# Patient Record
Sex: Female | Born: 2000 | Hispanic: Yes | Marital: Single | Smoking: Never smoker
Health system: Southern US, Community
[De-identification: ages and names within clinical notes are randomized; demographics above are authoritative.]

## PROBLEM LIST (undated history)

## (undated) DIAGNOSIS — Z8669 Personal history of other diseases of the nervous system and sense organs: Secondary | ICD-10-CM

## (undated) DIAGNOSIS — J45909 Unspecified asthma, uncomplicated: Secondary | ICD-10-CM

## (undated) DIAGNOSIS — E785 Hyperlipidemia, unspecified: Secondary | ICD-10-CM

## (undated) DIAGNOSIS — R51 Headache: Secondary | ICD-10-CM

## (undated) DIAGNOSIS — R519 Headache, unspecified: Secondary | ICD-10-CM

## (undated) HISTORY — DX: Headache: R51

## (undated) HISTORY — DX: Hyperlipidemia, unspecified: E78.5

## (undated) HISTORY — PX: TONSILLECTOMY: SUR1361

## (undated) HISTORY — DX: Headache, unspecified: R51.9

## (undated) HISTORY — DX: Unspecified asthma, uncomplicated: J45.909

## (undated) HISTORY — DX: Personal history of other diseases of the nervous system and sense organs: Z86.69

---
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
## 2018-03-04 ENCOUNTER — Telehealth: Payer: Self-pay | Admitting: *Deleted

## 2018-03-04 NOTE — Telephone Encounter (Signed)
Left message informing pt's mtr, pt would probably be able to drive, but if she had concerns she could have someone drive her and to make sure she wore a open toed shoe.

## 2018-03-04 NOTE — Telephone Encounter (Signed)
Pt's mtr, states pt is coming in for a toenail procedure on an infected toe, and she wanted to know if the pt would be able to drive after the procedure.

## 2018-03-05 ENCOUNTER — Ambulatory Visit (INDEPENDENT_AMBULATORY_CARE_PROVIDER_SITE_OTHER): Admitting: Podiatrist

## 2018-03-05 VITALS — BP 123/74 | HR 90

## 2018-03-05 DIAGNOSIS — L600 Ingrowing nail: Secondary | ICD-10-CM

## 2018-03-05 NOTE — Patient Instructions (Signed)
Soak Instructions    THE DAY AFTER THE PROCEDURE  Place 1/4 cup of epsom salts in a quart of warm tap water.  Submerge your foot or feet with outer bandage intact for the initial soak; this will allow the bandage to become moist and wet for easy lift off.  Once you remove your bandage, continue to soak in the solution for 20 minutes.  This soak should be done twice a day.  Next, remove your foot or feet from solution, blot dry the affected area and cover.  You may use a band aid large enough to cover the area or use gauze and tape.  Apply other medications to the area as directed by the doctor such as polysporin neosporin.   IT IS OKAY TO SWITCH TO CLEANSING THE TOE WITH DIAL ANTIBACTERIAL SOAP AND WATER-  AFTER 2 DAYS OF SOAKS.

## 2018-03-05 NOTE — Progress Notes (Signed)
  Chief Complaint  Patient presents with  . Nail Problem    Right foot 1st toenail lateral side ingrown. Pt states had it drained at urgent care 4 days ago. Pt denies fever/nausea/vomiting/chills.     HPI: Patient is 18 y.o. female who presents today for painful right hallux nail right first lateral side-  She states it was badly infected and it was drained last Friday at an urgent care in [place]-  Since then the nail has improved but it has given her trouble in the past and she would like it fixed.    Review of Systems  DATA OBTAINED: from patient  GENERAL: Feels well no fevers, no fatigue, no changes in appetite SKIN: No itching, no rashes, no open wounds EYES: No eye pain,no redness, no discharge EARS: No earache,no ringing of ears, NOSE: No congestion, no drainage, no bleeding  MOUTH/THROAT: No mouth pain, No sore throat, No difficulty chewing or swallowing  RESPIRATORY: No cough, no wheezing, no SOB CARDIAC: No chest pain,no heart palpitations, GI: No abdominal pain, No Nausea, no vomiting, no diarrhea, no heartburn or no reflux  GU: No dysuria, no increased frequency or urgency MUSCULOSKELETAL: No unrelieved bone/joint pain,  NEUROLOGIC: Awake, alert, appropriate to situation, No change in mental status. PSYCHIATRIC: No overt anxiety or sadness.No behavior issue.      Physical Exam  GENERAL APPEARANCE: Alert, conversant. Appropriately groomed. No acute distress.  VASCULAR: Pedal pulses palpable DP and PT bilateral.  Capillary refill time is immediate to all digits,  Proximal to distal cooling it warm to warm.  Digital hair growth is present bilateral  NEUROLOGIC: sensation is intact epicritically and protectively to 5.07 monofilament at 5/5 sites bilateral.  Light touch is intact bilateral, vibratory sensation intact bilateral, achilles tendon reflex is intact bilateral.  MUSCULOSKELETAL: acceptable muscle strength, tone and stability bilateral.  Intrinsic muscluature  intact bilateral.  Range of motion at ankle and first MPJ is normal bilateral.   DERMATOLOGIC: skin is warm, supple, and dry.  No open lesions noted.  No interdigital maceration noted bilateral.  Right hallux does not appear to be infected- no redness, swelling or drainage noted.  Lateral side of the toenail is painful with pressure and ingrown- non infected    Assessment   Non infected ingrown toenail right first lateral side  Plan  Treatment options and alternatives were discussed. Recommended a permanent removal of the  lateral nail border of the Right hallux nail. Patient agreed. Skin was prepped with alcohol and a local injection of lidocaine and Marcaine plain was infiltrated to anesthetize the toe. The toe was then prepped with Betadine exsanguinated. The offending nail border was removed and phenol applied.  It was cleansed well with alcohol. Antibiotic ointment and a dressing was then applied and the patient was given instructions for aftercare. She will be seen for a nail check in 1 week. And will call if any questions or concerns arise.  Marland Kitchen

## 2018-03-09 ENCOUNTER — Encounter: Payer: Self-pay | Admitting: Podiatrist

## 2018-03-12 ENCOUNTER — Ambulatory Visit (INDEPENDENT_AMBULATORY_CARE_PROVIDER_SITE_OTHER): Payer: Self-pay | Admitting: Podiatrist

## 2018-03-12 DIAGNOSIS — L600 Ingrowing nail: Secondary | ICD-10-CM

## 2018-03-12 NOTE — Progress Notes (Signed)
   Chief Complaint  Patient presents with  . Nail Problem    Right 1st lateral nail check. Pt states had some greenish drainage but has been mostly clear. Pt denies fever/nausea/vomiting/chills.    Patient presents today for follow up of ap nail procedure of the right hallux nail medial side- overall she is doing well.   Objective:  Neurovascular status intact and unchanged from previous visit.  Lateral side of the hallux nail is healing nicely- no sign of infection noted.    Assessment/Plan:  S/p ap nail- healing well- gave instructions for continued use of a bandaid and polysporin until no drainage is noticed on the bandage.  She will call if any concerns arise in the future.

## 2018-05-01 ENCOUNTER — Telehealth: Payer: Self-pay | Admitting: Podiatrist

## 2018-05-01 NOTE — Telephone Encounter (Signed)
Pt called stating that she hit her left great toenail on the wall last night and the nail split down the middle. Pt previously had ingrown toenail removed on the same toe and had some complications. Pt just concerned if the area will get infected and would like to know if she should come in to be seen or what she can do to keep it clean.

## 2018-05-02 ENCOUNTER — Telehealth: Payer: Self-pay | Admitting: Podiatrist

## 2018-05-02 NOTE — Telephone Encounter (Signed)
I called pt and she states day before yesterday she hit her toe that she had a procedure on and it bled at the time, she states she has been soaking and using the antibiotic ointment but wanted to make sure it would not get infected.

## 2018-05-02 NOTE — Telephone Encounter (Signed)
I called pt and she explained also that the toe would heal after the procedure but then would get a bump that would open up and she would continue to do the soaks and the area would heal then bump up again. I told pt that on occasion a small area of regrowth could occur or even scar tissue and may need another procedure, to continue the soaks and antibiotic ointment until seen in office. I transferred pt to schedulers.

## 2018-05-02 NOTE — Telephone Encounter (Signed)
Patient called back and would like to speak to nurse before scheduling an appointment.

## 2018-05-02 NOTE — Telephone Encounter (Signed)
LVM for patient to call with concerns about her toenail. Advised her to return my call or make an appt to be seen

## 2018-05-05 ENCOUNTER — Other Ambulatory Visit: Payer: Self-pay

## 2018-05-05 ENCOUNTER — Ambulatory Visit (INDEPENDENT_AMBULATORY_CARE_PROVIDER_SITE_OTHER): Admitting: Podiatry

## 2018-05-05 ENCOUNTER — Encounter: Payer: Self-pay | Admitting: Podiatry

## 2018-05-05 VITALS — Temp 97.7°F

## 2018-05-05 DIAGNOSIS — L600 Ingrowing nail: Secondary | ICD-10-CM | POA: Diagnosis not present

## 2018-05-05 DIAGNOSIS — L03031 Cellulitis of right toe: Secondary | ICD-10-CM

## 2018-05-05 NOTE — Patient Instructions (Signed)

## 2018-05-05 NOTE — Progress Notes (Signed)
Subjective:   Patient ID: Terri Davis, female   DOB: 18 y.o.   MRN: [id number]   HPI Patient presents with mother concerned about ingrown toenail deformity of the right hallux that seem to be doing well after correction but has started to give her a problem again and is making it difficult for her to wear shoe gear comfortably.  She points to the lateral side and there has been previous surgery done but they are concerned about infection   ROS      Objective:  Physical Exam  Incurvated right hallux nail lateral border with what appears to be some possible plugged tissue and still continued possible residual ingrown toenail     Assessment:  Appears to be a combination of possible paronychia infection right along with a ingrown toenail of the slightly medial border of the right lateral hallux to where the previous ingrown toenail was removed     Plan:  H&P education rendered and we did discuss she has family history of nail disease and long-term it is possible she is going to lose the entire nail.  We will get a focus on this corner and I did discuss soaks therapy and possible antibiotics and the fact there may be a localized paronychia and I educated her on this and at this point I did discuss there may be further ingrown toenail that may need to be corrected.  I went ahead today and I did infiltrate the right hallux 60 mg like Marcaine mixture I allowed her to signed consent form after review and sterile prep applied to the toe and then using sterile instrumentation I removed a small bit more of the lateral border of the right hallux nail lateral side I then cleaned the entire border out flushed the area out did not note any acute drainage and then applied chemical phenol 3 applications 30 seconds followed by alcohol lavage sterile dressing.  Gave instructions on soaks and reappoint for [place] to recheck again in the next several weeks and encouraged her to call with questions wrote a  prescription for drops and to leave dressing on 24 hours but to take it off earlier if any problems were to occur

## 2018-05-06 ENCOUNTER — Telehealth: Payer: Self-pay | Admitting: Podiatry

## 2018-05-06 MED ORDER — NEOMYCIN-POLYMYXIN-HC 1 % OT SOLN: OTIC | 1 refills | Status: AC

## 2018-05-06 NOTE — Telephone Encounter (Signed)
Left message informing pt the drops had not been sent to the requested pharmacy, but has been changed to her requested [facility] [id number].

## 2018-05-06 NOTE — Telephone Encounter (Signed)
Patient was seen yesterday for an ingrown toenail and was going to have a prescription for medicated drops sent in to her pharmacy but they have not received the prescription.  Pharmacy: [facility] on [address] in [place]

## 2018-05-06 NOTE — Addendum Note (Signed)
Addended by: Alphia Kava D on: 05/06/2018 01:51 PM   Modules accepted: Orders

## 2018-09-08 NOTE — Telephone Encounter (Signed)
Entered in error

## 2018-10-10 ENCOUNTER — Other Ambulatory Visit: Payer: Self-pay

## 2018-10-10 ENCOUNTER — Ambulatory Visit (INDEPENDENT_AMBULATORY_CARE_PROVIDER_SITE_OTHER): Admitting: Podiatry

## 2018-10-10 ENCOUNTER — Encounter: Payer: Self-pay | Admitting: Podiatry

## 2018-10-10 DIAGNOSIS — L600 Ingrowing nail: Secondary | ICD-10-CM | POA: Diagnosis not present

## 2018-10-10 MED ORDER — NEOMYCIN-POLYMYXIN-HC 3.5-10000-1 OT SOLN: OTIC | 1 refills | Status: AC

## 2018-10-10 NOTE — Patient Instructions (Signed)

## 2018-10-10 NOTE — Progress Notes (Signed)
   Subjective:    Patient ID: Terri Davis, female    DOB: [birth year], 18 y.o.   MRN: [id number]  HPI    Review of Systems  All other systems reviewed and are negative.      Objective:   Physical Exam        Assessment & Plan:

## 2018-10-15 NOTE — Progress Notes (Signed)
Subjective:   Patient ID: Terri Davis, female   DOB: 18 y.o.   MRN: [id number]   HPI Patient presents stating she is developed an ingrown toenail of her left big toe and states that it is sore and she is tried to soak it in treatment without relief of symptoms   ROS      Objective:  Physical Exam  Neurovascular status intact with incurvated lateral border left hallux is painful when pressed with no redness or drainage noted     Assessment:  Ingrown toenail deformity left hallux lateral border     Plan:  H&P reviewed condition recommended correction allowed patient to read and signed consent form going over correction.  Explained risk to patient she is willing to accept risk and today I infiltrated 60 mg like Marcaine mixture sterile prep applied to the toe and using sterile instrumentation I remove the lateral border exposed matrix applied phenol 3 applications 30 seconds followed by alcohol by sterile dressing and gave instructions on soaks.  Reappoint to recheck and leave dressing on 24 hours but take it off earlier if it should start to throb and also wrote prescription for drops and encouraged her to call with questions

## 2019-03-13 ENCOUNTER — Encounter: Payer: Self-pay | Admitting: General Practice
